# Patient Record
Sex: Female | Born: 1970 | Race: White | Hispanic: No | Marital: Married | Smoking: Never smoker
Health system: Southern US, Community
[De-identification: ages and names within clinical notes are randomized; demographics above are authoritative.]

## PROBLEM LIST (undated history)

## (undated) DIAGNOSIS — F32A Depression, unspecified: Secondary | ICD-10-CM

## (undated) DIAGNOSIS — F329 Major depressive disorder, single episode, unspecified: Secondary | ICD-10-CM

## (undated) DIAGNOSIS — I10 Essential (primary) hypertension: Secondary | ICD-10-CM

## (undated) DIAGNOSIS — E039 Hypothyroidism, unspecified: Secondary | ICD-10-CM

## (undated) DIAGNOSIS — R51 Headache: Secondary | ICD-10-CM

## (undated) HISTORY — DX: Headache: R51

## (undated) HISTORY — DX: Hypothyroidism, unspecified: E03.9

## (undated) HISTORY — DX: Major depressive disorder, single episode, unspecified: F32.9

## (undated) HISTORY — DX: Depression, unspecified: F32.A

## (undated) HISTORY — DX: Essential (primary) hypertension: I10

## (undated) HISTORY — PX: CHOLECYSTECTOMY: SHX55

---

## 2006-05-22 ENCOUNTER — Ambulatory Visit: Payer: Self-pay | Admitting: Internal Medicine

## 2006-05-22 ENCOUNTER — Other Ambulatory Visit: Admission: RE | Admit: 2006-05-22 | Discharge: 2006-05-22 | Payer: Self-pay | Admitting: Family Medicine

## 2006-06-10 ENCOUNTER — Ambulatory Visit: Payer: Self-pay | Admitting: Internal Medicine

## 2006-06-10 ENCOUNTER — Encounter (INDEPENDENT_AMBULATORY_CARE_PROVIDER_SITE_OTHER): Payer: Self-pay | Admitting: *Deleted

## 2009-05-17 ENCOUNTER — Encounter (INDEPENDENT_AMBULATORY_CARE_PROVIDER_SITE_OTHER): Payer: Self-pay | Admitting: *Deleted

## 2009-07-04 ENCOUNTER — Encounter: Admission: RE | Admit: 2009-07-04 | Discharge: 2009-07-04 | Payer: Self-pay | Admitting: General Surgery

## 2009-12-29 ENCOUNTER — Telehealth (INDEPENDENT_AMBULATORY_CARE_PROVIDER_SITE_OTHER): Payer: Self-pay | Admitting: *Deleted

## 2010-01-02 ENCOUNTER — Encounter: Admission: RE | Admit: 2010-01-02 | Discharge: 2010-01-02 | Payer: Self-pay | Admitting: General Surgery

## 2010-09-18 NOTE — Progress Notes (Signed)
Summary: Schedule recall colonoscopy  Phone Note Outgoing Call Call back at [facility] Phone [phone]   Call placed by: Christie Nottingham CMA Duncan Dull),  Dec 29, 2009 4:03 PM Call placed to: Patient Summary of Call: Called pt to schedule recall colonoscopy and number is busy.  Initial call taken by: Christie Nottingham CMA Duncan Dull),  Dec 29, 2009 4:03 PM  Follow-up for Phone Call        Pt states she did recieve the letter last year for the procedure. She has had a lot of dental work and medical issues this last and this year which has caused her to be in a financial slump. 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 She states she will have to call back to schedule the procedure after all of the other medical issues have been resolved.  Follow-up by: Christie Nottingham CMA Duncan Dull),  Jan 05, 2010 10:26 AM

## 2011-01-04 NOTE — Assessment & Plan Note (Signed)
[facility]                           GASTROENTEROLOGY OFFICE NOTE   VEVERLY, LARIMER                     MRN:          [id number]  DATE:05/22/2006                            DOB:          07-23-1971    REASON FOR CONSULTATION:  Blood in stool.   ASSESSMENT:  A 40 year old white woman with intermittent blood in the stool  over 4 years.  There is a family history of colon cancer.  She also had  subsequently hemoccult positive stools documented, she tells me.   She also has several months worth of heartburn and indigestion symptoms.  She has been using some BCs and Goodys only in the past month because of a  tooth ache.  Could be linked.   RECOMMENDATIONS AND PLAN:  1. She has been given a proton pump inhibitor prescription by Dr. Dimple Casey,      and that makes sense, stepping up from Zantac.  2. Schedule colonoscopy to investigate the bleeding and because of her      higher risk of colon cancer given the family history of colon cancer      (father had rectal cancer).  Risks, benefits, and indications are      reviewed with the patient.  She understands and agrees to proceed.   HISTORY:  A 40 year old white woman that says for the past 4 years she has  had small amounts of blood on the toilet paper and then maybe with the stool  that has come and gone, but it has gotten worse to where she has had a dark,  red clot.  No melena.  Blood is mixed in with the stool, though it does not  seem to turn the water red.  Sometimes she bloats after she eats, and she  has heartburn and indigestion all the time not controlled by Zantac.  She  has been taking either BCs or Goodys some for some headache issues and some  history of dental pain.   MEDICATIONS:  1. Levoxyl 80 mg daily.  2. Zantac 150 mg b.i.d.  3. Intermittent BCs and Goodys.   DRUG ALLERGIES:  She is allergic to [facility].   PAST MEDICAL HISTORY:  1. Cholecystectomy.  2. Upper GI endoscopy  with some sort of benign nodule about 10 years ago      prior to her cholecystectomy [facility], do not have records).  3. Hypertension in the past, not now.  4. History of depression and anxiety.  5. Hypothyroidism.  6. Chronic intermittent headaches.  7. Allergic rhinosinusitis.   FAMILY HISTORY:  As stated above.  Mother also had breast cancer.  Second-  degree relatives with heart disease and diabetes.   SOCIAL HISTORY:  She is married.  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  One son, one daughter.  No alcohol, tobacco, or drugs.   REVIEW OF SYSTEMS:  See my medical history form for full details.   PHYSICAL EXAMINATION:  GENERAL:  A well-developed, well-nourished white  woman, overweight, no acute distress.  VITAL SIGNS:  Height 5 feet 4 inches, weight 173 pounds, blood pressure  122/76, pulse 56 regular.  EYES:  Anicteric.  ENT:  Tongue, oropharynx look normal.  NECK:  Supple.  No mass.  CHEST:  Clear.  HEART:  S1, S2, no rubs, murmurs, or gallops.  ABDOMEN:  Soft, nontender without organomegaly or mass.  RECTAL EXAM:  Deferred.  EXTREMITIES:  No peripheral edema.  SKIN:  Warm, dry, no rash.  PSYCHIATRIC:  She is alert and oriented x3.  LYMPH NODES:  No neck or supraclavicular nodes.   I have reviewed the office note from [facility].   I appreciate the opportunity to care for this patient.       Iva Boop, MD,FACG      CEG/MedQ  DD:  05/22/2006  DT:  05/23/2006  Job #:  [id number]   cc:   Wardell Heath, FNP

## 2011-03-02 IMAGING — CT CT ABDOMEN WO/W CM
3 of 8 series · 14 of 36 positions shown, 19 images · IV contrast (OMNI 300, WATER & [ID] OMNI 300)
Comparison: CT abdomen of 07/04/2009

CLINICAL DATA: Follow up of splenic lesion

CT ABDOMEN WITHOUT AND WITH CONTRAST
TECHNIQUE: Multidetector CT imaging of the abdomen was performed
following the standard protocol before and during bolus
administration of intravenous contrast.
Contrast: 100 ml Ymnipaque-IVV

[Series 4: angio · axial · 0.70mm/px · z∈[-217,-52]mm · 4 of 110 slices shown, 9 images (1 of 2)]
[im 22/110  soft-tissue]
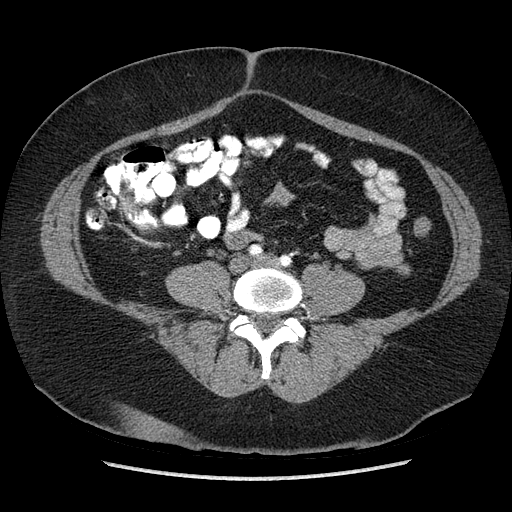
[im 22/110  lung]
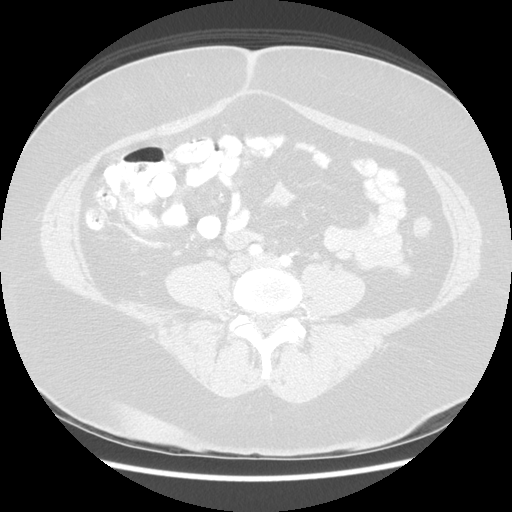
[im 22/110  bone]
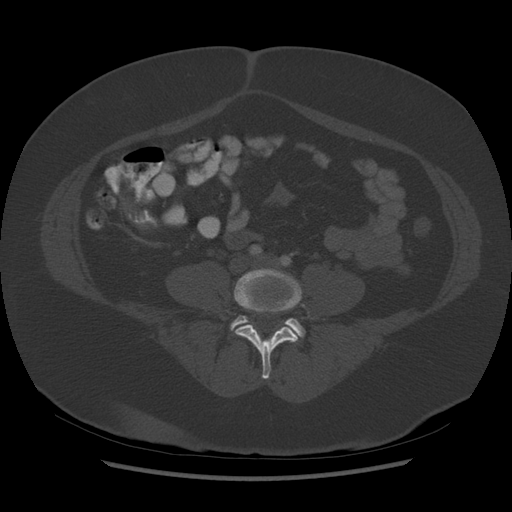
[im 44/110  soft-tissue]
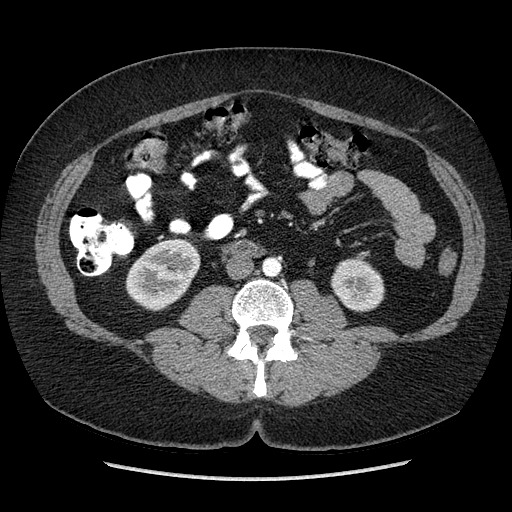
[im 44/110  lung]
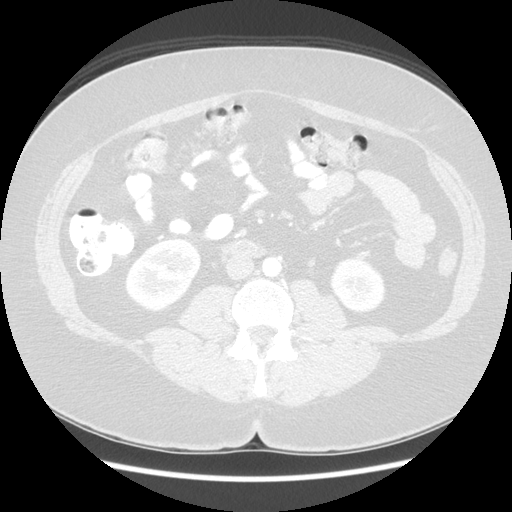
[im 66/110  soft-tissue]
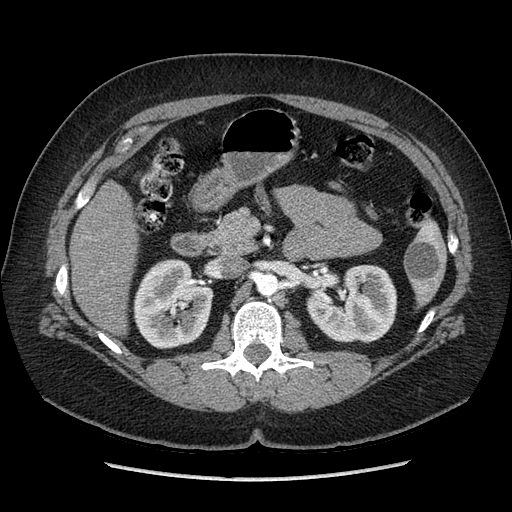
[im 66/110  lung]
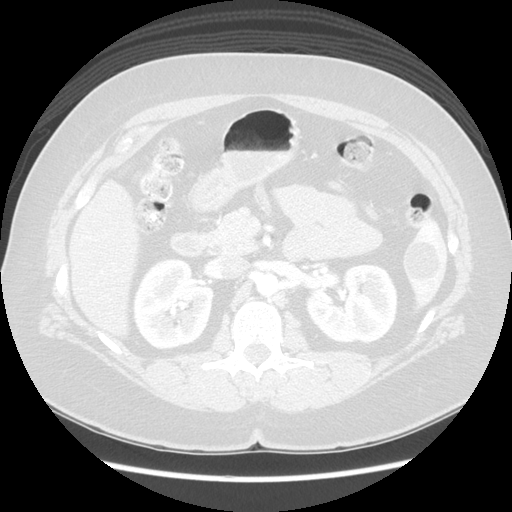
[im 88/110  soft-tissue]
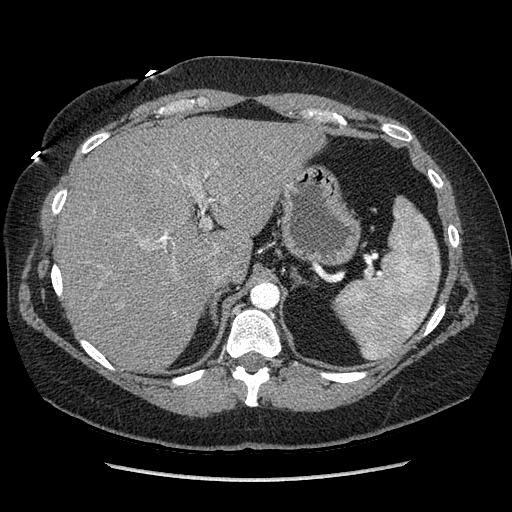
[im 88/110  lung]
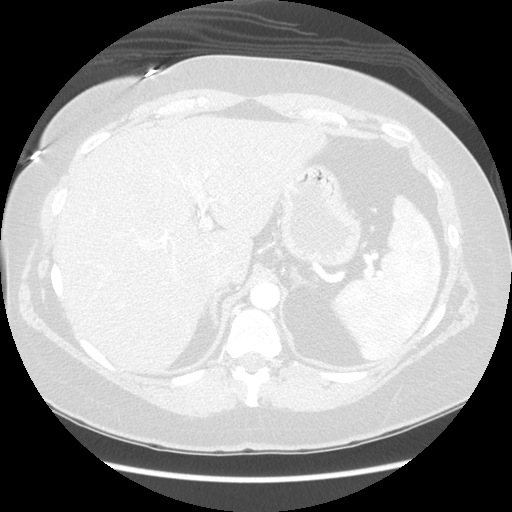

[Series 7: angio · axial · 0.82mm/px · z∈[-216,-51]mm · 4 of 110 slices shown (2 of 2)]
[im 22/110  soft-tissue]
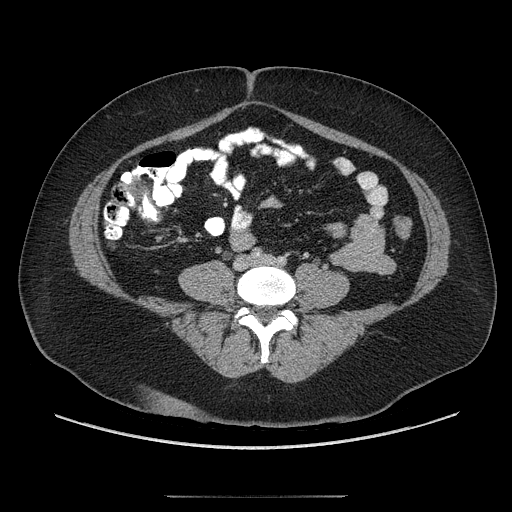
[im 44/110  soft-tissue]
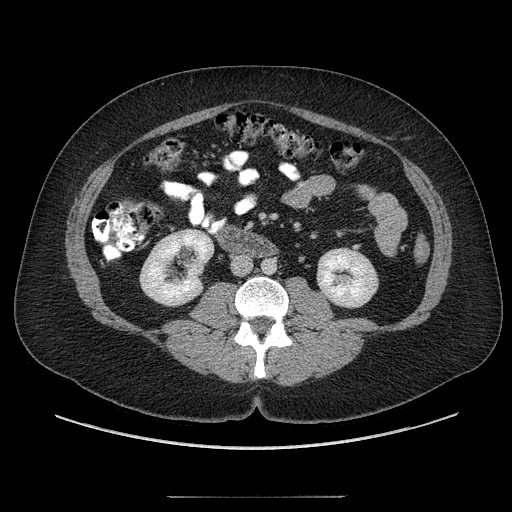
[im 66/110  soft-tissue]
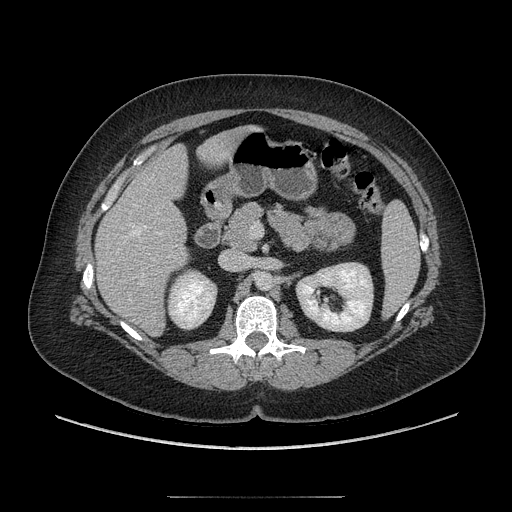
[im 88/110  soft-tissue]
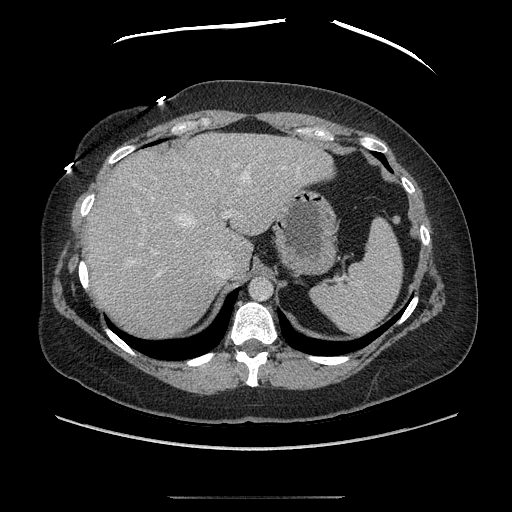

[Series 602: sagittal body · sagittal · 0.82mm/px · 6 of 165 slices shown]
[im 21/165  soft-tissue]
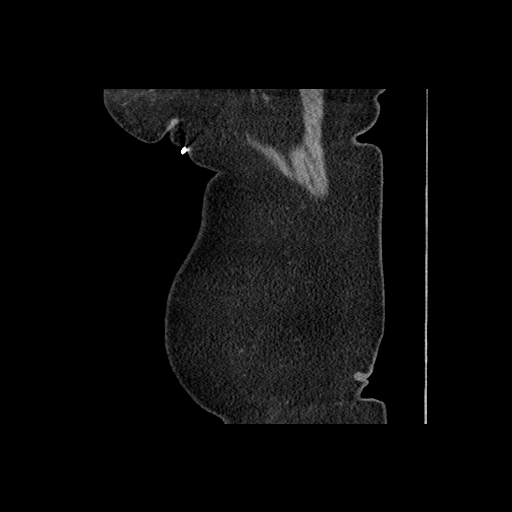
[im 42/165  soft-tissue]
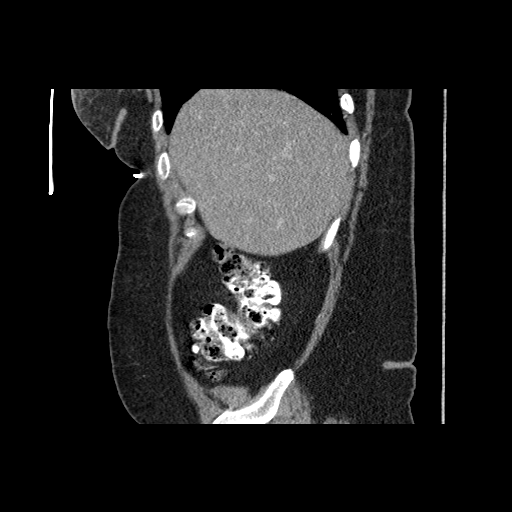
[im 62/165  soft-tissue]
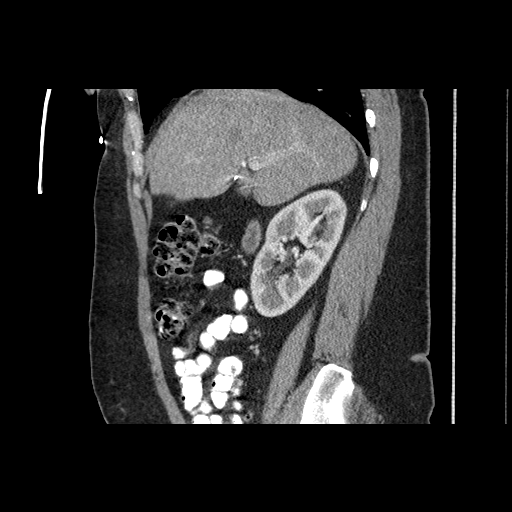
[im 83/165  soft-tissue]
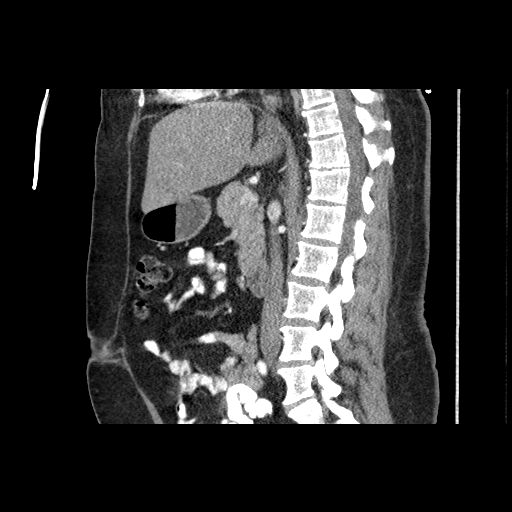
[im 103/165  soft-tissue]
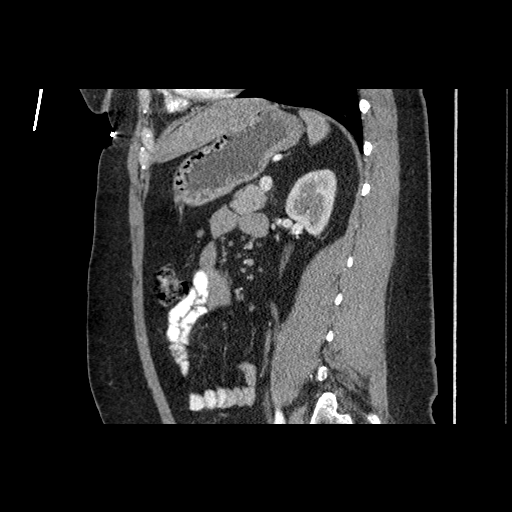
[im 124/165  soft-tissue]
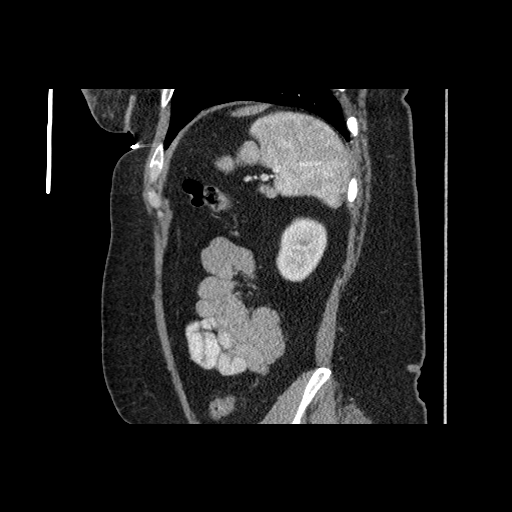

[14 of 36 positions shown; findings below may reference images not displayed]

FINDINGS: The lung bases are clear.   On this unenhanced portion of
the study, no renal calculi are seen.  Surgical clips are present
from prior cholecystectomy.

On the arterial phase, no hepatic abnormality is seen.  No ductal
dilatation is noted.  The pancreas is normal in size and the
pancreatic duct is not dilated.  The adrenal glands are stable.
The low attenuation lesion noted in the inferior aspect of the
spleen has further diminished in size, now measuring 24 x 27 x 29
mm compared to 39 x 44 x 42 mm previously.  No complicating
features are seen.  The kidneys enhance with no significant
abnormality and no hydronephrosis is noted.  The abdominal aorta is
normal in caliber.  The celiac axis, SMA, renal arteries, and IMA
all appear patent.  No abdominal aortic aneurysm is seen.  Small
nodes are present within the right lower quadrant of questionable
significance.  The appendix is grossly normal.
IMPRESSION: Further decrease in size of inferior splenic cyst with no
complicating features noted.

## 2011-11-22 ENCOUNTER — Encounter: Payer: Self-pay | Admitting: *Deleted

## 2011-11-26 ENCOUNTER — Encounter: Payer: Self-pay | Admitting: Cardiology

## 2011-11-28 ENCOUNTER — Ambulatory Visit: Payer: Self-pay | Admitting: Cardiology

## 2015-12-26 DIAGNOSIS — F331 Major depressive disorder, recurrent, moderate: Secondary | ICD-10-CM | POA: Diagnosis not present

## 2015-12-26 DIAGNOSIS — Z0000 Encounter for general adult medical examination without abnormal findings: Secondary | ICD-10-CM | POA: Diagnosis not present

## 2015-12-26 DIAGNOSIS — E039 Hypothyroidism, unspecified: Secondary | ICD-10-CM | POA: Diagnosis not present

## 2015-12-26 DIAGNOSIS — Z1231 Encounter for screening mammogram for malignant neoplasm of breast: Secondary | ICD-10-CM | POA: Diagnosis not present

## 2015-12-26 DIAGNOSIS — E559 Vitamin D deficiency, unspecified: Secondary | ICD-10-CM | POA: Diagnosis not present

## 2015-12-26 DIAGNOSIS — Z1389 Encounter for screening for other disorder: Secondary | ICD-10-CM | POA: Diagnosis not present

## 2015-12-26 DIAGNOSIS — Z6830 Body mass index (BMI) 30.0-30.9, adult: Secondary | ICD-10-CM | POA: Diagnosis not present

## 2016-12-16 DIAGNOSIS — Z6831 Body mass index (BMI) 31.0-31.9, adult: Secondary | ICD-10-CM | POA: Diagnosis not present

## 2016-12-16 DIAGNOSIS — J209 Acute bronchitis, unspecified: Secondary | ICD-10-CM | POA: Diagnosis not present

## 2016-12-27 DIAGNOSIS — Z1389 Encounter for screening for other disorder: Secondary | ICD-10-CM | POA: Diagnosis not present

## 2016-12-27 DIAGNOSIS — Z1231 Encounter for screening mammogram for malignant neoplasm of breast: Secondary | ICD-10-CM | POA: Diagnosis not present

## 2016-12-27 DIAGNOSIS — Z0000 Encounter for general adult medical examination without abnormal findings: Secondary | ICD-10-CM | POA: Diagnosis not present

## 2016-12-27 DIAGNOSIS — E039 Hypothyroidism, unspecified: Secondary | ICD-10-CM | POA: Diagnosis not present

## 2016-12-27 DIAGNOSIS — E559 Vitamin D deficiency, unspecified: Secondary | ICD-10-CM | POA: Diagnosis not present

## 2016-12-27 DIAGNOSIS — Z6831 Body mass index (BMI) 31.0-31.9, adult: Secondary | ICD-10-CM | POA: Diagnosis not present

## 2017-05-27 DIAGNOSIS — R5383 Other fatigue: Secondary | ICD-10-CM | POA: Diagnosis not present

## 2017-05-27 DIAGNOSIS — E559 Vitamin D deficiency, unspecified: Secondary | ICD-10-CM | POA: Diagnosis not present

## 2017-05-27 DIAGNOSIS — E039 Hypothyroidism, unspecified: Secondary | ICD-10-CM | POA: Diagnosis not present

## 2017-05-27 DIAGNOSIS — F331 Major depressive disorder, recurrent, moderate: Secondary | ICD-10-CM | POA: Diagnosis not present

## 2017-11-03 DIAGNOSIS — R109 Unspecified abdominal pain: Secondary | ICD-10-CM | POA: Diagnosis not present

## 2017-11-03 DIAGNOSIS — R110 Nausea: Secondary | ICD-10-CM | POA: Diagnosis not present

## 2017-11-03 DIAGNOSIS — R319 Hematuria, unspecified: Secondary | ICD-10-CM | POA: Diagnosis not present

## 2017-11-03 DIAGNOSIS — R5383 Other fatigue: Secondary | ICD-10-CM | POA: Diagnosis not present

## 2017-11-03 DIAGNOSIS — Z6831 Body mass index (BMI) 31.0-31.9, adult: Secondary | ICD-10-CM | POA: Diagnosis not present

## 2017-12-29 DIAGNOSIS — Z6831 Body mass index (BMI) 31.0-31.9, adult: Secondary | ICD-10-CM | POA: Diagnosis not present

## 2017-12-29 DIAGNOSIS — E039 Hypothyroidism, unspecified: Secondary | ICD-10-CM | POA: Diagnosis not present

## 2017-12-29 DIAGNOSIS — Z1231 Encounter for screening mammogram for malignant neoplasm of breast: Secondary | ICD-10-CM | POA: Diagnosis not present

## 2017-12-29 DIAGNOSIS — E559 Vitamin D deficiency, unspecified: Secondary | ICD-10-CM | POA: Diagnosis not present

## 2017-12-29 DIAGNOSIS — Z0000 Encounter for general adult medical examination without abnormal findings: Secondary | ICD-10-CM | POA: Diagnosis not present

## 2018-01-26 DIAGNOSIS — L821 Other seborrheic keratosis: Secondary | ICD-10-CM | POA: Diagnosis not present

## 2018-01-26 DIAGNOSIS — D485 Neoplasm of uncertain behavior of skin: Secondary | ICD-10-CM | POA: Diagnosis not present

## 2018-02-26 DIAGNOSIS — L918 Other hypertrophic disorders of the skin: Secondary | ICD-10-CM | POA: Diagnosis not present

## 2018-02-26 DIAGNOSIS — D485 Neoplasm of uncertain behavior of skin: Secondary | ICD-10-CM | POA: Diagnosis not present

## 2018-02-26 DIAGNOSIS — Z6831 Body mass index (BMI) 31.0-31.9, adult: Secondary | ICD-10-CM | POA: Diagnosis not present

## 2018-04-01 DIAGNOSIS — F331 Major depressive disorder, recurrent, moderate: Secondary | ICD-10-CM | POA: Diagnosis not present

## 2018-04-01 DIAGNOSIS — Z6831 Body mass index (BMI) 31.0-31.9, adult: Secondary | ICD-10-CM | POA: Diagnosis not present

## 2018-04-01 DIAGNOSIS — R6889 Other general symptoms and signs: Secondary | ICD-10-CM | POA: Diagnosis not present

## 2018-07-27 DIAGNOSIS — J00 Acute nasopharyngitis [common cold]: Secondary | ICD-10-CM | POA: Diagnosis not present

## 2018-07-27 DIAGNOSIS — Z6832 Body mass index (BMI) 32.0-32.9, adult: Secondary | ICD-10-CM | POA: Diagnosis not present

## 2018-08-10 DIAGNOSIS — G5601 Carpal tunnel syndrome, right upper limb: Secondary | ICD-10-CM | POA: Diagnosis not present

## 2018-08-10 DIAGNOSIS — Z6830 Body mass index (BMI) 30.0-30.9, adult: Secondary | ICD-10-CM | POA: Diagnosis not present

## 2018-08-10 DIAGNOSIS — R030 Elevated blood-pressure reading, without diagnosis of hypertension: Secondary | ICD-10-CM | POA: Diagnosis not present

## 2018-08-17 DIAGNOSIS — R739 Hyperglycemia, unspecified: Secondary | ICD-10-CM | POA: Diagnosis not present

## 2018-08-17 DIAGNOSIS — Z6831 Body mass index (BMI) 31.0-31.9, adult: Secondary | ICD-10-CM | POA: Diagnosis not present

## 2018-08-17 DIAGNOSIS — R030 Elevated blood-pressure reading, without diagnosis of hypertension: Secondary | ICD-10-CM | POA: Diagnosis not present

## 2018-08-24 DIAGNOSIS — E039 Hypothyroidism, unspecified: Secondary | ICD-10-CM | POA: Diagnosis not present

## 2018-08-24 DIAGNOSIS — I10 Essential (primary) hypertension: Secondary | ICD-10-CM | POA: Diagnosis not present

## 2018-08-24 DIAGNOSIS — R030 Elevated blood-pressure reading, without diagnosis of hypertension: Secondary | ICD-10-CM | POA: Diagnosis not present

## 2018-08-24 DIAGNOSIS — F411 Generalized anxiety disorder: Secondary | ICD-10-CM | POA: Diagnosis not present

## 2018-08-24 DIAGNOSIS — F331 Major depressive disorder, recurrent, moderate: Secondary | ICD-10-CM | POA: Diagnosis not present

## 2018-08-24 DIAGNOSIS — Z6831 Body mass index (BMI) 31.0-31.9, adult: Secondary | ICD-10-CM | POA: Diagnosis not present

## 2018-08-24 DIAGNOSIS — R000 Tachycardia, unspecified: Secondary | ICD-10-CM | POA: Diagnosis not present

## 2018-08-24 DIAGNOSIS — R739 Hyperglycemia, unspecified: Secondary | ICD-10-CM | POA: Diagnosis not present

## 2018-08-26 DIAGNOSIS — R000 Tachycardia, unspecified: Secondary | ICD-10-CM | POA: Diagnosis not present

## 2018-08-26 DIAGNOSIS — R079 Chest pain, unspecified: Secondary | ICD-10-CM | POA: Diagnosis not present

## 2018-09-29 ENCOUNTER — Encounter: Payer: Self-pay | Admitting: *Deleted

## 2018-09-30 ENCOUNTER — Encounter: Payer: Self-pay | Admitting: Cardiovascular Disease

## 2018-09-30 ENCOUNTER — Ambulatory Visit: Payer: BLUE CROSS/BLUE SHIELD | Admitting: Cardiovascular Disease

## 2018-09-30 VITALS — BP 120/83 | HR 74 | Ht 64.0 in | Wt 186.4 lb

## 2018-09-30 DIAGNOSIS — E039 Hypothyroidism, unspecified: Secondary | ICD-10-CM | POA: Diagnosis not present

## 2018-09-30 DIAGNOSIS — R030 Elevated blood-pressure reading, without diagnosis of hypertension: Secondary | ICD-10-CM | POA: Diagnosis not present

## 2018-09-30 DIAGNOSIS — I10 Essential (primary) hypertension: Secondary | ICD-10-CM

## 2018-09-30 DIAGNOSIS — R5383 Other fatigue: Secondary | ICD-10-CM

## 2018-09-30 DIAGNOSIS — R0789 Other chest pain: Secondary | ICD-10-CM

## 2018-09-30 DIAGNOSIS — F331 Major depressive disorder, recurrent, moderate: Secondary | ICD-10-CM | POA: Diagnosis not present

## 2018-09-30 DIAGNOSIS — Z1331 Encounter for screening for depression: Secondary | ICD-10-CM | POA: Diagnosis not present

## 2018-09-30 DIAGNOSIS — R739 Hyperglycemia, unspecified: Secondary | ICD-10-CM | POA: Diagnosis not present

## 2018-09-30 DIAGNOSIS — Z1389 Encounter for screening for other disorder: Secondary | ICD-10-CM | POA: Diagnosis not present

## 2018-09-30 DIAGNOSIS — R002 Palpitations: Secondary | ICD-10-CM | POA: Diagnosis not present

## 2018-09-30 MED ORDER — METOPROLOL TARTRATE 25 MG PO TABS: 12.5000 mg | ORAL_TABLET | Freq: Two times a day (BID) | ORAL | Status: DC

## 2018-09-30 NOTE — Patient Instructions (Signed)
Medication Instructions:  Continue all current medications.  Labwork:   Testing/Procedures:  Your physician has requested that you have an echocardiogram. Echocardiography is a painless test that uses sound waves to create images of your heart. It provides your doctor with information about the size and shape of your heart and how well your heart's chambers and valves are working. This procedure takes approximately one hour. There are no restrictions for this procedure.  Your physician has recommended that you wear a 1 week event monitor. Event monitors are medical devices that record the heart's electrical activity. Doctors most often [place] these monitors to diagnose arrhythmias. Arrhythmias are problems with the speed or rhythm of the heartbeat. The monitor is a small, portable device. You can wear one while you do your normal daily activities. This is usually used to diagnose what is causing palpitations/syncope (passing out).  Office will contact with results via phone or letter.    Follow-Up: 4 months   Any Other Special Instructions Will Be Listed Below (If Applicable).  If you need a refill on your cardiac medications before your next appointment, please call your pharmacy.

## 2018-09-30 NOTE — Progress Notes (Signed)
CARDIOLOGY CONSULT NOTE  Patient ID: Victoria HaltSherry F Derasmo MRN: [id number] DOB/AGE: 47/03/1971 47 y.o.  Admit date: (Not on file) Primary Physician: Juliette AlcideBurdine, Steven E, MD Referring Physician: Juliette AlcideBurdine, Steven E, MD  Reason for Consultation: Palpitations and chest pain  HPI: Victoria Buchanan is a 48 y.o. female who is being seen today for the evaluation of palpitations and chest pain at the request of Burdine, Ananias PilgrimSteven E, MD.   I reviewed notes from her PCP.  Past medical history includes GERD and hypothyroidism.  She was started on Lopressor by her PCP.  I personally reviewed an ECG performed on 08/17/2018 which showed sinus rhythm with no ischemic ST segment or T wave abnormalities, nor any arrhythmias.  I reviewed an exercise tolerance test performed [facility] on 08/26/2018 which showed that the patient exercised for a total of 6 minutes and achieved 7.2 METS.  It was deemed to be "a normal stress test with poor exercise tolerance with a low to intermediate probability of age or cardiac events."  She told me she has a long history of anxiety and takes clonazepam as needed.  She said it has been many years since she has had a panic attack.  She had been experiencing spikes in heart rate and blood pressure.  It appears she had been on a diuretic before.  She now feels chest pressure which is fairly constant even during my interview.  She now experiences palpitations only about once per week since starting Lopressor 12.5 mg twice daily.  She denies leg swelling, orthopnea, and paroxysmal nocturnal dyspnea.  She does feel fatigued and wonders if it is due to Lopressor but she is reluctant about coming off of it.  She used to work 3 jobs and now works 1 full-time job and one part-time job.  She has been exercising at the gym 3 to 4 days/week.     Allergies  Allergen Reactions  . Yasmin [Drospirenone-Ethinyl Estradiol] Rash    Current Outpatient Medications    Medication Sig Dispense Refill  . Biotin 4098110000 MCG TBDP Take 1 tablet by mouth daily.    Marland Kitchen. buPROPion (WELLBUTRIN) 100 MG tablet Take 100 mg by mouth 3 (three) times daily.    . Cholecalciferol (VITAMIN D) 50 MCG (2000 UT) tablet Take 2,000 Units by mouth daily.    Marland Kitchen. levonorgestrel-ethinyl estradiol (AVIANE,ALESSE,LESSINA) 0.1-20 MG-MCG tablet Take 1 tablet by mouth daily.    Marland Kitchen. levothyroxine (SYNTHROID, LEVOTHROID) 100 MCG tablet Take 100 mcg by mouth daily.    Marland Kitchen. omeprazole (PRILOSEC) 20 MG capsule Take 20 mg by mouth daily.    Marland Kitchen. UNABLE TO FIND Take 1 tablet by mouth daily. Med Name: Mega Red    . vitamin C (ASCORBIC ACID) 500 MG tablet Take 500 mg by mouth daily.    . vitamin E 400 UNIT capsule Take 400 Units by mouth daily.    . metoprolol tartrate (LOPRESSOR) 25 MG tablet Take 0.5 tablets (12.5 mg total) by mouth 2 (two) times daily.     No current facility-administered medications for this visit.     Past Medical History:  Diagnosis Date  . Allergic rhinitis   . Depression   . Essential hypertension, benign   . Headache(784.0)   . Hypothyroidism     Past Surgical History:  Procedure Laterality Date  . CHOLECYSTECTOMY      Social History   Socioeconomic History  . Marital status: Married    Spouse name: Not on file  .  Number of children: Not on file  . Years of education: Not on file  . Highest education level: Not on file  Occupational History  . Not on file  Social Needs  . Financial resource strain: Not on file  . Food insecurity:    Worry: Not on file    Inability: Not on file  . Transportation needs:    Medical: Not on file    Non-medical: Not on file  Tobacco Use  . Smoking status: Never Smoker  . Smokeless tobacco: Never Used  Substance and Sexual Activity  . Alcohol use: No  . Drug use: No  . Sexual activity: Not on file  Lifestyle  . Physical activity:    Days per week: Not on file    Minutes per session: Not on file  . Stress: Not on file   Relationships  . Social connections:    Talks on phone: Not on file    Gets together: Not on file    Attends religious service: Not on file    Active member of club or organization: Not on file    Attends meetings of clubs or organizations: Not on file    Relationship status: Not on file  . Intimate partner violence:    Fear of current or ex partner: Not on file    Emotionally abused: Not on file    Physically abused: Not on file    Forced sexual activity: Not on file  Other Topics Concern  . Not on file  Social History Narrative  . Not on file     No family history of premature CAD in 1st degree relatives.  Current Meds  Medication Sig  . Biotin [id number] MCG TBDP Take 1 tablet by mouth daily.  Marland Kitchen. buPROPion (WELLBUTRIN) 100 MG tablet Take 100 mg by mouth 3 (three) times daily.  . Cholecalciferol (VITAMIN D) 50 MCG (2000 UT) tablet Take 2,000 Units by mouth daily.  Marland Kitchen. levonorgestrel-ethinyl estradiol (AVIANE,ALESSE,LESSINA) 0.1-20 MG-MCG tablet Take 1 tablet by mouth daily.  Marland Kitchen. levothyroxine (SYNTHROID, LEVOTHROID) 100 MCG tablet Take 100 mcg by mouth daily.  Marland Kitchen. omeprazole (PRILOSEC) 20 MG capsule Take 20 mg by mouth daily.  Marland Kitchen. UNABLE TO FIND Take 1 tablet by mouth daily. Med Name: Mega Red  . vitamin C (ASCORBIC ACID) 500 MG tablet Take 500 mg by mouth daily.  . vitamin E 400 UNIT capsule Take 400 Units by mouth daily.      Review of systems complete and found to be negative unless listed above in HPI    Physical exam Blood pressure 120/83, pulse 74, height 5\' 4"  (1.626 m), weight 186 lb 6.4 oz (84.6 kg), SpO2 99 %. General: NAD Neck: No JVD, no thyromegaly or thyroid nodule.  Lungs: Clear to auscultation bilaterally with normal respiratory effort. CV: Nondisplaced PMI. Regular rate and rhythm, normal S1/S2, no S3/S4, no murmur.  No peripheral edema.  No carotid bruit.    Abdomen: Soft, nontender, no distention.  Skin: Intact without lesions or rashes.  Neurologic: Alert and  oriented x 3.  Psych: Normal affect. Extremities: No clubbing or cyanosis.  HEENT: Normal.   ECG: Most recent ECG reviewed.   Labs: No results found for: K, BUN, CREATININE, ALT, TSH, HGB   Lipids: No results found for: LDLCALC, LDLDIRECT, CHOL, TRIG, HDL      ASSESSMENT AND PLAN:   1.  Palpitations: Symptomatically improved on Lopressor 12.5 mg twice daily.  Unclear if she was experiencing symptomatic PACs or PVCs.  I will obtain a 1 week event monitor. I will order a 2-D echocardiogram with Doppler to evaluate cardiac structure, function, and regional wall motion.  2.  Chest pressure: Symptoms are atypical for ischemic heart disease.  Exercise tolerance test reviewed above.  She is on low-dose Lopressor 12.5 mg twice daily. I will order a 2-D echocardiogram with Doppler to evaluate cardiac structure, function, and regional wall motion.  3.  Hypertension: Controlled on present therapy.  No changes.  4.  Fatigue: While this may be due to beta-blockers, she is also deconditioned and only recently begun exercising at the gym 3 to 4 days/week.  I will reassess symptoms at the time of her next visit.    Disposition: Follow up in 4 months  Signed: Prentice Docker, M.D., F.A.C.C.  09/30/2018, 11:23 AM

## 2018-10-08 ENCOUNTER — Telehealth: Payer: Self-pay | Admitting: Cardiovascular Disease

## 2018-10-08 NOTE — Telephone Encounter (Signed)
Patient called stating that she wants to know if it will be necessary to wear a heart monitor.

## 2018-10-08 NOTE — Telephone Encounter (Signed)
Pt aware that per Dr Junius Argyle notes at [facility] he would recommend 1 week event monitor to exclude underlying rhythm that may be causing palpitations and also scheduled for echo - pt agreeable to monitor

## 2018-10-14 ENCOUNTER — Ambulatory Visit (INDEPENDENT_AMBULATORY_CARE_PROVIDER_SITE_OTHER): Payer: BLUE CROSS/BLUE SHIELD

## 2018-10-14 DIAGNOSIS — R002 Palpitations: Secondary | ICD-10-CM

## 2018-10-21 ENCOUNTER — Ambulatory Visit (INDEPENDENT_AMBULATORY_CARE_PROVIDER_SITE_OTHER): Payer: BLUE CROSS/BLUE SHIELD

## 2018-10-21 ENCOUNTER — Telehealth: Payer: Self-pay | Admitting: Cardiovascular Disease

## 2018-10-21 ENCOUNTER — Encounter: Payer: Self-pay | Admitting: Cardiovascular Disease

## 2018-10-21 DIAGNOSIS — R0789 Other chest pain: Secondary | ICD-10-CM

## 2018-10-21 NOTE — Telephone Encounter (Signed)
Informed patient that end of service report not available yet.  Will notify as soon as we get then.  She verbalized understanding.

## 2018-10-21 NOTE — Telephone Encounter (Signed)
Asking for results of her monitor °

## 2018-10-21 NOTE — Telephone Encounter (Signed)
Notes recorded by Lesle Chris, LPN on 09/27/5282 at 6:43 PM EST Patient notified. Copy to pmd. Follow up scheduled for 01/28/2019. ------  Notes recorded by Laqueta Linden, MD on 10/21/2018 at 9:50 AM EST Normal echo.

## 2018-10-26 ENCOUNTER — Telehealth: Payer: Self-pay | Admitting: *Deleted

## 2018-10-26 ENCOUNTER — Telehealth: Payer: Self-pay | Admitting: Cardiovascular Disease

## 2018-10-26 DIAGNOSIS — R509 Fever, unspecified: Secondary | ICD-10-CM | POA: Diagnosis not present

## 2018-10-26 DIAGNOSIS — J0100 Acute maxillary sinusitis, unspecified: Secondary | ICD-10-CM | POA: Diagnosis not present

## 2018-10-26 NOTE — Telephone Encounter (Signed)
Patient called requesting results of recent  monitor.  Please call cell # .

## 2018-10-26 NOTE — Telephone Encounter (Signed)
Pt aware - updated medication list - pt has f/u in June

## 2018-10-26 NOTE — Telephone Encounter (Signed)
This was started by her PCP and symptoms improved (this is what she reported to me at office visit). Metoprolol is purely for symptom suppression in her case. If she would like to try stopping it that would be fine.

## 2018-10-26 NOTE — Telephone Encounter (Signed)
Pt aware - routed to pcp  

## 2018-10-26 NOTE — Telephone Encounter (Signed)
-----   Message from Laqueta Linden, MD sent at 10/26/2018  4:01 PM EDT -----     Sinus rhythm and sinus tachycardia. No signficant arrhythmias.  Symptoms correlated with HR 90 bpm and 104-108 bpm.

## 2018-10-26 NOTE — Telephone Encounter (Signed)
Resulted. No significant arrhythmias.

## 2018-10-26 NOTE — Telephone Encounter (Signed)
Pt aware - wanted to know if she still needed to take metoprolol - says at LOV she asked if test came back normal that she could stop this

## 2018-12-31 DIAGNOSIS — Z6830 Body mass index (BMI) 30.0-30.9, adult: Secondary | ICD-10-CM | POA: Diagnosis not present

## 2018-12-31 DIAGNOSIS — R21 Rash and other nonspecific skin eruption: Secondary | ICD-10-CM | POA: Diagnosis not present

## 2019-01-28 ENCOUNTER — Ambulatory Visit: Payer: BC Managed Care – PPO | Admitting: Cardiovascular Disease

## 2019-01-28 ENCOUNTER — Ambulatory Visit: Payer: BLUE CROSS/BLUE SHIELD | Admitting: Cardiovascular Disease

## 2019-02-23 DIAGNOSIS — N926 Irregular menstruation, unspecified: Secondary | ICD-10-CM | POA: Diagnosis not present

## 2019-02-23 DIAGNOSIS — R21 Rash and other nonspecific skin eruption: Secondary | ICD-10-CM | POA: Diagnosis not present

## 2019-02-23 DIAGNOSIS — Z6830 Body mass index (BMI) 30.0-30.9, adult: Secondary | ICD-10-CM | POA: Diagnosis not present

## 2019-02-23 DIAGNOSIS — Z01419 Encounter for gynecological examination (general) (routine) without abnormal findings: Secondary | ICD-10-CM | POA: Diagnosis not present

## 2019-02-23 DIAGNOSIS — Z1231 Encounter for screening mammogram for malignant neoplasm of breast: Secondary | ICD-10-CM | POA: Diagnosis not present

## 2019-02-23 DIAGNOSIS — E039 Hypothyroidism, unspecified: Secondary | ICD-10-CM | POA: Diagnosis not present

## 2019-02-23 DIAGNOSIS — Z0000 Encounter for general adult medical examination without abnormal findings: Secondary | ICD-10-CM | POA: Diagnosis not present

## 2019-03-25 DIAGNOSIS — Z6832 Body mass index (BMI) 32.0-32.9, adult: Secondary | ICD-10-CM | POA: Diagnosis not present

## 2019-03-25 DIAGNOSIS — R87610 Atypical squamous cells of undetermined significance on cytologic smear of cervix (ASC-US): Secondary | ICD-10-CM | POA: Diagnosis not present

## 2019-05-05 DIAGNOSIS — L280 Lichen simplex chronicus: Secondary | ICD-10-CM | POA: Diagnosis not present

## 2019-05-05 DIAGNOSIS — L500 Allergic urticaria: Secondary | ICD-10-CM | POA: Diagnosis not present

## 2019-05-24 DIAGNOSIS — L309 Dermatitis, unspecified: Secondary | ICD-10-CM | POA: Diagnosis not present

## 2019-05-24 DIAGNOSIS — L280 Lichen simplex chronicus: Secondary | ICD-10-CM | POA: Diagnosis not present

## 2019-05-24 DIAGNOSIS — L500 Allergic urticaria: Secondary | ICD-10-CM | POA: Diagnosis not present

## 2019-05-24 DIAGNOSIS — L80 Vitiligo: Secondary | ICD-10-CM | POA: Diagnosis not present

## 2019-05-24 DIAGNOSIS — F411 Generalized anxiety disorder: Secondary | ICD-10-CM | POA: Diagnosis not present

## 2019-05-24 DIAGNOSIS — L501 Idiopathic urticaria: Secondary | ICD-10-CM | POA: Diagnosis not present

## 2019-06-18 DIAGNOSIS — F411 Generalized anxiety disorder: Secondary | ICD-10-CM | POA: Diagnosis not present

## 2019-06-18 DIAGNOSIS — L280 Lichen simplex chronicus: Secondary | ICD-10-CM | POA: Diagnosis not present

## 2019-06-18 DIAGNOSIS — L501 Idiopathic urticaria: Secondary | ICD-10-CM | POA: Diagnosis not present

## 2019-06-18 DIAGNOSIS — L80 Vitiligo: Secondary | ICD-10-CM | POA: Diagnosis not present

## 2019-08-02 DIAGNOSIS — L280 Lichen simplex chronicus: Secondary | ICD-10-CM | POA: Diagnosis not present

## 2019-08-02 DIAGNOSIS — L501 Idiopathic urticaria: Secondary | ICD-10-CM | POA: Diagnosis not present

## 2019-08-02 DIAGNOSIS — L80 Vitiligo: Secondary | ICD-10-CM | POA: Diagnosis not present

## 2019-08-02 DIAGNOSIS — F411 Generalized anxiety disorder: Secondary | ICD-10-CM | POA: Diagnosis not present

## 2019-08-02 DIAGNOSIS — L509 Urticaria, unspecified: Secondary | ICD-10-CM | POA: Diagnosis not present

## 2019-10-25 DIAGNOSIS — E559 Vitamin D deficiency, unspecified: Secondary | ICD-10-CM | POA: Diagnosis not present

## 2019-10-25 DIAGNOSIS — F411 Generalized anxiety disorder: Secondary | ICD-10-CM | POA: Diagnosis not present

## 2019-10-25 DIAGNOSIS — D519 Vitamin B12 deficiency anemia, unspecified: Secondary | ICD-10-CM | POA: Diagnosis not present

## 2019-10-25 DIAGNOSIS — R5383 Other fatigue: Secondary | ICD-10-CM | POA: Diagnosis not present

## 2019-10-25 DIAGNOSIS — Z6830 Body mass index (BMI) 30.0-30.9, adult: Secondary | ICD-10-CM | POA: Diagnosis not present

## 2020-01-31 DIAGNOSIS — B3783 Candidal cheilitis: Secondary | ICD-10-CM | POA: Diagnosis not present

## 2020-04-04 DIAGNOSIS — E039 Hypothyroidism, unspecified: Secondary | ICD-10-CM | POA: Diagnosis not present

## 2020-04-04 DIAGNOSIS — Z6827 Body mass index (BMI) 27.0-27.9, adult: Secondary | ICD-10-CM | POA: Diagnosis not present

## 2020-04-04 DIAGNOSIS — M7711 Lateral epicondylitis, right elbow: Secondary | ICD-10-CM | POA: Diagnosis not present

## 2020-04-04 DIAGNOSIS — M7701 Medial epicondylitis, right elbow: Secondary | ICD-10-CM | POA: Diagnosis not present

## 2020-04-04 DIAGNOSIS — Z23 Encounter for immunization: Secondary | ICD-10-CM | POA: Diagnosis not present

## 2020-04-04 DIAGNOSIS — Z01419 Encounter for gynecological examination (general) (routine) without abnormal findings: Secondary | ICD-10-CM | POA: Diagnosis not present

## 2020-05-11 DIAGNOSIS — Z1231 Encounter for screening mammogram for malignant neoplasm of breast: Secondary | ICD-10-CM | POA: Diagnosis not present

## 2020-05-12 DIAGNOSIS — E039 Hypothyroidism, unspecified: Secondary | ICD-10-CM | POA: Diagnosis not present

## 2020-07-11 DIAGNOSIS — E039 Hypothyroidism, unspecified: Secondary | ICD-10-CM | POA: Diagnosis not present

## 2020-07-24 DIAGNOSIS — L280 Lichen simplex chronicus: Secondary | ICD-10-CM | POA: Diagnosis not present
